# Patient Record
Sex: Female | Born: 2000 | Race: White | Hispanic: No | Marital: Single
Health system: Southern US, Community
[De-identification: ages and names within clinical notes are randomized; demographics above are authoritative.]

## PROBLEM LIST (undated history)

## (undated) DIAGNOSIS — Z789 Other specified health status: Secondary | ICD-10-CM

---

## 2020-01-19 ENCOUNTER — Ambulatory Visit (INDEPENDENT_AMBULATORY_CARE_PROVIDER_SITE_OTHER): Payer: Self-pay

## 2020-01-19 ENCOUNTER — Other Ambulatory Visit: Payer: Self-pay | Admitting: Sports Medicine

## 2020-01-19 ENCOUNTER — Ambulatory Visit (INDEPENDENT_AMBULATORY_CARE_PROVIDER_SITE_OTHER): Payer: Self-pay | Admitting: Sports Medicine

## 2020-01-19 DIAGNOSIS — S6991XA Unspecified injury of right wrist, hand and finger(s), initial encounter: Secondary | ICD-10-CM

## 2020-01-19 NOTE — Assessment & Plan Note (Addendum)
This is a pleasant 19 year old female, she injured her finger earlier today while roughhousing with a friend. Clinically there is some angulation and rotation, with tenderness at the middle phalanx, x-rays confirmed the above x-rays were personally reviewed, middle phalangeal comminuted fracture with rotation and angulation, this will need open reduction and internal fixation versus simple percutaneous fixation. Static volar splint applied today. Referral to Dr. Amanda Pea.

## 2020-01-19 NOTE — Progress Notes (Signed)
    Procedures performed today:    None.  Independent interpretation of notes and tests performed by another provider:   None.  Brief History, Exam, Impression, and Recommendations:    Heather Hurst is a pleasant 19yo female who presents today with a fractured middle phalanx of the right fourth digit. She said she broke it while she was "rough housing"/"play fighting" with a friend. She is currently in pain, but denies wanting any pain medications other than OTC ibuprofen. Due to the extent of the injury she is going to need surgical repair. We have referred her for surgery.   Aurelio Jew, MS3   ___________________________________________ Ihor Austin. Benjamin Stain, M.D., ABFM., CAQSM. Primary Care and Sports Medicine [facility]  Adjunct Instructor of Family Medicine  [employer]

## 2020-01-26 ENCOUNTER — Emergency Department (HOSPITAL_COMMUNITY): Payer: Medicaid Other

## 2020-01-26 ENCOUNTER — Emergency Department (HOSPITAL_COMMUNITY): Payer: Medicaid Other | Admitting: Anesthesiology

## 2020-01-26 ENCOUNTER — Encounter (HOSPITAL_COMMUNITY): Admission: EM | Disposition: A | Discharge status: Home / Self Care | Payer: Self-pay | Source: Home / Self Care

## 2020-01-26 ENCOUNTER — Encounter (HOSPITAL_COMMUNITY): Payer: Self-pay | Admitting: Orthopedic Surgery

## 2020-01-26 ENCOUNTER — Ambulatory Visit (HOSPITAL_COMMUNITY)
Admission: EM | Admit: 2020-01-26 | Discharge: 2020-01-26 | Disposition: A | Discharge status: Home / Self Care | Payer: Medicaid Other | Attending: Emergency Medicine | Admitting: Emergency Medicine

## 2020-01-26 DIAGNOSIS — Z20822 Contact with and (suspected) exposure to covid-19: Secondary | ICD-10-CM | POA: Insufficient documentation

## 2020-01-26 DIAGNOSIS — S62621A Displaced fracture of medial phalanx of left index finger, initial encounter for closed fracture: Secondary | ICD-10-CM

## 2020-01-26 DIAGNOSIS — S67194A Crushing injury of right ring finger, initial encounter: Secondary | ICD-10-CM | POA: Insufficient documentation

## 2020-01-26 DIAGNOSIS — X58XXXA Exposure to other specified factors, initial encounter: Secondary | ICD-10-CM | POA: Insufficient documentation

## 2020-01-26 DIAGNOSIS — S62624A Displaced fracture of medial phalanx of right ring finger, initial encounter for closed fracture: Secondary | ICD-10-CM | POA: Insufficient documentation

## 2020-01-26 DIAGNOSIS — W19XXXA Unspecified fall, initial encounter: Secondary | ICD-10-CM | POA: Insufficient documentation

## 2020-01-26 HISTORY — DX: Other specified health status: Z78.9

## 2020-01-26 HISTORY — PX: OPEN REDUCTION INTERNAL FIXATION (ORIF) HAND: SHX5991

## 2020-01-26 LAB — SARS CORONAVIRUS 2 BY RT PCR (HOSPITAL ORDER, PERFORMED IN ~~LOC~~ HOSPITAL LAB): SARS Coronavirus 2: NEGATIVE

## 2020-01-26 LAB — PREGNANCY, URINE: Preg Test, Ur: NEGATIVE

## 2020-01-26 SURGERY — OPEN REDUCTION INTERNAL FIXATION (ORIF) HAND
Anesthesia: Monitor Anesthesia Care | Site: Hand | Laterality: Right

## 2020-01-26 MED ORDER — MIDAZOLAM HCL 2 MG/2ML IJ SOLN
INTRAMUSCULAR | Status: AC
  Administered 2020-01-26: 2 mg via INTRAVENOUS
  Filled 2020-01-26: qty 2

## 2020-01-26 MED ORDER — PROPOFOL 500 MG/50ML IV EMUL
INTRAVENOUS | Status: DC | PRN
  Administered 2020-01-26: 100 ug/kg/min via INTRAVENOUS

## 2020-01-26 MED ORDER — MIDAZOLAM HCL 2 MG/2ML IJ SOLN
INTRAMUSCULAR | Status: AC
  Filled 2020-01-26: qty 2

## 2020-01-26 MED ORDER — ONDANSETRON HCL 4 MG/2ML IJ SOLN
INTRAMUSCULAR | Status: DC | PRN
  Administered 2020-01-26: 4 mg via INTRAVENOUS

## 2020-01-26 MED ORDER — LACTATED RINGERS IV SOLN: INTRAVENOUS | Status: DC | PRN

## 2020-01-26 MED ORDER — CEPHALEXIN 500 MG PO CAPS: 500.0000 mg | ORAL_CAPSULE | Freq: Four times a day (QID) | ORAL | 0 refills | Status: AC

## 2020-01-26 MED ORDER — MIDAZOLAM HCL 2 MG/2ML IJ SOLN: 2.0000 mg | Freq: Once | INTRAMUSCULAR | Status: AC

## 2020-01-26 MED ORDER — FENTANYL CITRATE (PF) 100 MCG/2ML IJ SOLN
INTRAMUSCULAR | Status: AC
  Administered 2020-01-26: 100 ug via INTRAVENOUS
  Filled 2020-01-26: qty 2

## 2020-01-26 MED ORDER — FENTANYL CITRATE (PF) 100 MCG/2ML IJ SOLN: 100.0000 ug | Freq: Once | INTRAMUSCULAR | Status: AC

## 2020-01-26 MED ORDER — PROPOFOL 10 MG/ML IV BOLUS
INTRAVENOUS | Status: DC | PRN
  Administered 2020-01-26: 25 mg via INTRAVENOUS

## 2020-01-26 MED ORDER — PHENYLEPHRINE 40 MCG/ML (10ML) SYRINGE FOR IV PUSH (FOR BLOOD PRESSURE SUPPORT)
PREFILLED_SYRINGE | INTRAVENOUS | Status: DC | PRN
  Administered 2020-01-26 (×4): 40 ug via INTRAVENOUS

## 2020-01-26 MED ORDER — CEFAZOLIN SODIUM-DEXTROSE 2-3 GM-%(50ML) IV SOLR
INTRAVENOUS | Status: DC | PRN
  Administered 2020-01-26: 2 g via INTRAVENOUS

## 2020-01-26 MED ORDER — CHLORHEXIDINE GLUCONATE 0.12 % MT SOLN
15.0000 mL | Freq: Once | OROMUCOSAL | Status: AC
  Administered 2020-01-26: 15 mL via OROMUCOSAL
  Filled 2020-01-26: qty 15

## 2020-01-26 MED ORDER — BUPIVACAINE HCL (PF) 0.25 % IJ SOLN: INTRAMUSCULAR | Status: DC | PRN

## 2020-01-26 MED ORDER — BUPIVACAINE HCL (PF) 0.25 % IJ SOLN
INTRAMUSCULAR | Status: AC
  Filled 2020-01-26: qty 30

## 2020-01-26 MED ORDER — 0.9 % SODIUM CHLORIDE (POUR BTL) OPTIME
TOPICAL | Status: DC | PRN
  Administered 2020-01-26: 1000 mL

## 2020-01-26 MED ORDER — LACTATED RINGERS IV SOLN: INTRAVENOUS | Status: DC

## 2020-01-26 MED ORDER — FENTANYL CITRATE (PF) 250 MCG/5ML IJ SOLN
INTRAMUSCULAR | Status: AC
  Filled 2020-01-26: qty 5

## 2020-01-26 MED ORDER — PROPOFOL 10 MG/ML IV BOLUS
INTRAVENOUS | Status: AC
  Filled 2020-01-26: qty 20

## 2020-01-26 MED ORDER — HYDROMORPHONE HCL 1 MG/ML IJ SOLN: 0.2500 mg | INTRAMUSCULAR | Status: DC | PRN

## 2020-01-26 MED ORDER — DIPHENHYDRAMINE HCL 50 MG/ML IJ SOLN
INTRAMUSCULAR | Status: AC
  Filled 2020-01-26: qty 1

## 2020-01-26 MED ORDER — BUPIVACAINE-EPINEPHRINE (PF) 0.5% -1:200000 IJ SOLN
INTRAMUSCULAR | Status: DC | PRN
  Administered 2020-01-26: 30 mL via PERINEURAL

## 2020-01-26 MED ORDER — MIDAZOLAM HCL 5 MG/5ML IJ SOLN
INTRAMUSCULAR | Status: DC | PRN
  Administered 2020-01-26: 2 mg via INTRAVENOUS

## 2020-01-26 MED ORDER — OXYCODONE HCL 5 MG PO TABS: 5.0000 mg | ORAL_TABLET | ORAL | 0 refills | Status: AC | PRN

## 2020-01-26 SURGICAL SUPPLY — 57 items
BIT DRILL 1.1X3.5 QK RELEA (DRILL) ×1 IMPLANT
BIT DRILL 1.1X3.5 QUICK RELEAS (DRILL) ×3
BLADE CLIPPER SURG (BLADE) IMPLANT
BNDG ELASTIC 3X5.8 VLCR STR LF (GAUZE/BANDAGES/DRESSINGS) IMPLANT
BNDG ELASTIC 4X5.8 VLCR STR LF (GAUZE/BANDAGES/DRESSINGS) IMPLANT
BNDG ESMARK 4X9 LF (GAUZE/BANDAGES/DRESSINGS) ×3 IMPLANT
BNDG GAUZE ELAST 4 BULKY (GAUZE/BANDAGES/DRESSINGS) ×3 IMPLANT
CORD BIPOLAR FORCEPS 12FT (ELECTRODE) ×3 IMPLANT
COVER SURGICAL LIGHT HANDLE (MISCELLANEOUS) ×3 IMPLANT
COVER WAND RF STERILE (DRAPES) IMPLANT
CUFF TOURN SGL QUICK 18X4 (TOURNIQUET CUFF) ×3 IMPLANT
CUFF TOURN SGL QUICK 24 (TOURNIQUET CUFF)
CUFF TRNQT CYL 24X4X16.5-23 (TOURNIQUET CUFF) IMPLANT
DECANTER SPIKE VIAL GLASS SM (MISCELLANEOUS) IMPLANT
DRAIN TLS ROUND 10FR (DRAIN) IMPLANT
DRAPE OEC MINIVIEW 54X84 (DRAPES) ×3 IMPLANT
DRAPE U-SHAPE 47X51 STRL (DRAPES) ×3 IMPLANT
DRSG ADAPTIC 3X8 NADH LF (GAUZE/BANDAGES/DRESSINGS) IMPLANT
DRSG EMULSION OIL 3X3 NADH (GAUZE/BANDAGES/DRESSINGS) ×3 IMPLANT
GAUZE SPONGE 4X4 12PLY STRL (GAUZE/BANDAGES/DRESSINGS) ×3 IMPLANT
GAUZE XEROFORM 1X8 LF (GAUZE/BANDAGES/DRESSINGS) ×3 IMPLANT
GAUZE XEROFORM 5X9 LF (GAUZE/BANDAGES/DRESSINGS) IMPLANT
GLOVE BIOGEL M 8.0 STRL (GLOVE) ×3 IMPLANT
GLOVE SS BIOGEL STRL SZ 8 (GLOVE) ×1 IMPLANT
GLOVE SUPERSENSE BIOGEL SZ 8 (GLOVE) ×2
GOWN STRL REUS W/ TWL LRG LVL3 (GOWN DISPOSABLE) ×3 IMPLANT
GOWN STRL REUS W/ TWL XL LVL3 (GOWN DISPOSABLE) ×3 IMPLANT
GOWN STRL REUS W/TWL LRG LVL3 (GOWN DISPOSABLE) ×6
GOWN STRL REUS W/TWL XL LVL3 (GOWN DISPOSABLE) ×6
KIT BASIN OR (CUSTOM PROCEDURE TRAY) ×3 IMPLANT
KIT TURNOVER KIT B (KITS) ×3 IMPLANT
MANIFOLD NEPTUNE II (INSTRUMENTS) ×3 IMPLANT
NEEDLE 22X1 1/2 (OR ONLY) (NEEDLE) IMPLANT
NS IRRIG 1000ML POUR BTL (IV SOLUTION) ×3 IMPLANT
PACK ORTHO EXTREMITY (CUSTOM PROCEDURE TRAY) ×3 IMPLANT
PAD ARMBOARD 7.5X6 YLW CONV (MISCELLANEOUS) ×6 IMPLANT
PAD CAST 4YDX4 CTTN HI CHSV (CAST SUPPLIES) ×3 IMPLANT
PADDING CAST COTTON 4X4 STRL (CAST SUPPLIES) ×6
PLATE COMPRESS 0.8 6H N/S HAND (Plate) ×3 IMPLANT
SCREW BONE LAG 1.5X10MM HEXA (Screw) ×1 IMPLANT
SCREW LAG HEXALOBE 1.5X10 (Screw) ×3 IMPLANT
SCREW NLOCK 1.5X9 (Screw) ×6 IMPLANT
SOL PREP POV-IOD 4OZ 10% (MISCELLANEOUS) ×6 IMPLANT
SPONGE LAP 4X18 RFD (DISPOSABLE) IMPLANT
SUT MNCRL AB 4-0 PS2 18 (SUTURE) ×3 IMPLANT
SUT PROLENE 3 0 PS 2 (SUTURE) IMPLANT
SUT PROLENE 4 0 PS 2 18 (SUTURE) ×3 IMPLANT
SUT VIC AB 3-0 FS2 27 (SUTURE) IMPLANT
SYR CONTROL 10ML LL (SYRINGE) ×3 IMPLANT
SYSTEM CHEST DRAIN TLS 7FR (DRAIN) IMPLANT
TOWEL GREEN STERILE (TOWEL DISPOSABLE) ×3 IMPLANT
TOWEL GREEN STERILE FF (TOWEL DISPOSABLE) ×3 IMPLANT
TUBE CONNECTING 12'X1/4 (SUCTIONS)
TUBE CONNECTING 12X1/4 (SUCTIONS) IMPLANT
TUBE EVACUATION TLS (MISCELLANEOUS) IMPLANT
UNDERPAD 30X36 HEAVY ABSORB (UNDERPADS AND DIAPERS) ×3 IMPLANT
WATER STERILE IRR 1000ML POUR (IV SOLUTION) ×3 IMPLANT

## 2020-01-26 NOTE — Anesthesia Preprocedure Evaluation (Addendum)
Anesthesia Evaluation  Patient identified by MRN, date of birth, ID band Patient awake    Reviewed: Allergy & Precautions, H&P , NPO status , Patient's Chart, lab work & pertinent test results  Airway Mallampati: II  TM Distance: >3 FB Neck ROM: Full    Dental no notable dental hx. (+) Teeth Intact, Dental Advisory Given   Pulmonary neg pulmonary ROS,    Pulmonary exam normal breath sounds clear to auscultation       Cardiovascular negative cardio ROS   Rhythm:Regular Rate:Normal     Neuro/Psych negative neurological ROS  negative psych ROS   GI/Hepatic negative GI ROS, Neg liver ROS,   Endo/Other  negative endocrine ROS  Renal/GU negative Renal ROS  negative genitourinary   Musculoskeletal   Abdominal   Peds  Hematology negative hematology ROS (+)   Anesthesia Other Findings   Reproductive/Obstetrics negative OB ROS                            Anesthesia Physical Anesthesia Plan  ASA: II  Anesthesia Plan: MAC and Regional   Post-op Pain Management:    Induction: Intravenous  PONV Risk Score and Plan: 3 and Ondansetron, Midazolam and Propofol infusion  Airway Management Planned: Simple Face Mask  Additional Equipment:   Intra-op Plan:   Post-operative Plan:   Informed Consent: I have reviewed the patients History and Physical, chart, labs and discussed the procedure including the risks, benefits and alternatives for the proposed anesthesia with the patient or authorized representative who has indicated his/her understanding and acceptance.     Dental advisory given  Plan Discussed with: CRNA  Anesthesia Plan Comments:        Anesthesia Quick Evaluation

## 2020-01-26 NOTE — Discharge Instructions (Signed)
Please elevate and keep your bandage clean and dry.  Please do not get it wet.  We have phoned in an antibiotic for you as well as a pain medicine.  Your arm will be numb for a period of time and then will improve in terms of his sensation.  Please do not take your bandage off.  We will see you in 14 days to remove your stitches.  You may move your finger within the confines of your splint but do not take your splint off.Keep bandage clean and dry.  Call for any problems.  No smoking.  Criteria for driving a car: you should be off your pain medicine for 7-8 hours, able to drive one handed(confident), thinking clearly and feeling able in your judgement to drive. Continue elevation as it will decrease swelling.  If instructed by MD move your fingers within the confines of the bandage/splint.  Use ice if instructed by your MD. Call immediately for any sudden loss of feeling in your hand/arm or change in functional abilities of the extremity.We recommend that you to take vitamin C 1000 mg a day to promote healing. We also recommend that if you require  pain medicine that you take a stool softener to prevent constipation as most pain medicines will have constipation side effects. We recommend either Peri-Colace or Senokot and recommend that you also consider adding MiraLAX as well to prevent the constipation affects from pain medicine if you are required to use them. These medicines are over the counter and may be purchased at a local pharmacy. A cup of yogurt and a probiotic can also be helpful during the recovery process as the medicines can disrupt your intestinal environment.

## 2020-01-26 NOTE — Anesthesia Procedure Notes (Signed)
Procedure Name: MAC Date/Time: 01/26/2020 7:15 PM Performed by: Jearld Pies, CRNA Pre-anesthesia Checklist: Patient identified, Emergency Drugs available, Suction available and Patient being monitored Patient Re-evaluated:Patient Re-evaluated prior to induction Oxygen Delivery Method: Simple face mask Preoxygenation: Pre-oxygenation with 100% oxygen

## 2020-01-26 NOTE — Transfer of Care (Signed)
Immediate Anesthesia Transfer of Care Note  Patient: Heather Hurst  Procedure(s) Performed: OPEN REDUCTION INTERNAL FIXATION (ORIF) right ring finger (Right Hand)  Patient Location: PACU  Anesthesia Type:MAC and Regional  Level of Consciousness: awake, alert  and oriented  Airway & Oxygen Therapy: Patient Spontanous Breathing and Patient connected to nasal cannula oxygen  Post-op Assessment: Report given to RN and Post -op Vital signs reviewed and stable  Post vital signs: Reviewed and stable  Last Vitals:  Vitals Value Taken Time  BP 116/85 01/26/20 2031  Temp 37.1 C 01/26/20 2030  Pulse 91 01/26/20 2036  Resp 22 01/26/20 2036  SpO2 100 % 01/26/20 2036  Vitals shown include unvalidated device data.  Last Pain:  Vitals:   01/26/20 2030  TempSrc:   PainSc: Asleep         Complications: No complications documented.

## 2020-01-26 NOTE — Anesthesia Procedure Notes (Signed)
Anesthesia Regional Block: Supraclavicular block   Pre-Anesthetic Checklist: ,, timeout performed, Correct Patient, Correct Site, Correct Laterality, Correct Procedure, Correct Position, site marked, Risks and benefits discussed, pre-op evaluation,  At surgeon's request and post-op pain management  Laterality: Right  Prep: Maximum Sterile Barrier Precautions used, chloraprep       Needles:  Injection technique: Single-shot  Needle Type: Echogenic Stimulator Needle     Needle Length: 9cm  Needle Gauge: 21     Additional Needles:   Procedures:,,,, ultrasound used (permanent image in chart),,,,  Narrative:  Start time: 01/26/2020 6:45 PM End time: 01/26/2020 6:55 PM Injection made incrementally with aspirations every 5 mL. Anesthesiologist: Gaynelle Adu, MD  Additional Notes: 2% Lidocaine skin wheel.

## 2020-01-26 NOTE — ED Triage Notes (Signed)
Pt fell and tried to catch herself with R hand, injuring R ring finger on 8/17. Supposed to f/u with ortho, but went today and was told she didn't have an appointment so was sent to the ED.

## 2020-01-26 NOTE — Op Note (Signed)
Operative note January 26, 2020  Heather Hurst  Preoperative diagnosis right ring finger middle phalanx fracture displaced intra-articular  Postop diagnosis the same  Operative procedure #1 open reduction internal fixation with an Acumed plate and screw construct right ring finger middle phalanx fracture #2 5 view radiographic series right ring finger #3 extensor tenolysis tenosynovectomy right ring finger  Surgeon Heather Hurst  Anesthesia block with IV sedation  Tourniquet time less than an hour  Estimated blood loss minimal  Description of procedure: Patient was taken to the operative theater counseled extensively.  Given the comminuted complex angulated and intra-articular nature of the fracture we elected to proceed with surgery.  She was taken to the surgical rain and underwent IV sedation as a block was in excellent working fashion.  Once this was complete the patient then underwent preop scrub and sterile draping.  Timeout was observed antibiotics were given in the form of Ancef and she underwent evaluation under fluoroscopy.  The displaced angulated fracture was noted.  I very carefully made an incision about the dorsal ulnar aspect of the finger dissection was carried down extensor tenolysis tenosynovectomy was accomplished due to the timeframe duration from injury to surgery.  I very carefully dealt with the tendon and preserve this.  The patient tolerated this well.  Following this I then reduce the fracture and applied a 4-hole Acumed plate.  I was able to achieve 4 cortices proximal and 2 cortices distal this construct was stable look well and the finger had excellent splay range of motion and rotatory alignment.  I was quite pleased with this.  I will begin early range of motion.  We irrigated copiously closed wound and took final copy x-rays.  There were no complicating features.  The tendon looks well following the surgery.  She will be discharged home on oxycodone and  Keflex.  She will return to see me in 14 days for suture removal and will begin therapy at that time.  All questions have been addressed.  Heather Millette MD

## 2020-01-26 NOTE — ED Provider Notes (Signed)
[facility] EMERGENCY DEPARTMENT Provider Note   CSN: [id number] Arrival date & time: 01/26/20  1333     History Chief Complaint  Patient presents with  . Finger Injury    Alan Riles is a 19 y.o. female.  HPI  Patient is a 19 year old female with no pertinent past medical history presented today with right ring finger pain that began on 8/17 when she fell to the ground and crushed her right finger.  She was seen by urgent care and found to have a fracture of the middle phalanx of her right middle finger with comminution.  Patient states that she has had relatively unchanged symptoms since that time.  She states the pain is achy, constant, worse with movement and touch.  She has been wearing a splint.   Patient sent to emergency department for surgical intervention by Dr. Amanda Pea.     No past medical history on file.  Patient Active Problem List   Diagnosis Date Noted  . Injury of right ring finger 01/19/2020    OB History   No obstetric history on file.     No family history on file.  Social History   Tobacco Use  . Smoking status: Not on file  Substance Use Topics  . Alcohol use: Not on file  . Drug use: Not on file    Home Medications Prior to Admission medications   Not on File    Allergies    Patient has no known allergies.  Review of Systems   Review of Systems  Constitutional: Negative for fever.  HENT: Negative for congestion.   Respiratory: Negative for shortness of breath.   Cardiovascular: Negative for chest pain.  Musculoskeletal:       Right ring finger pain  Neurological: Negative for dizziness and headaches.    Physical Exam Updated Vital Signs BP 121/77   Pulse 92   Temp 98.5 F (36.9 C) (Oral)   Resp 16   Ht 5' 2.5" (1.588 m)   Wt 79.4 kg   SpO2 99%   BMI 31.50 kg/m   Physical Exam Vitals and nursing note reviewed.  Constitutional:      General: She is not in acute distress.    Appearance: Normal  appearance. She is not ill-appearing.  HENT:     Head: Normocephalic and atraumatic.  Eyes:     General: No scleral icterus.       Right eye: No discharge.        Left eye: No discharge.     Conjunctiva/sclera: Conjunctivae normal.  Pulmonary:     Effort: Pulmonary effort is normal.     Breath sounds: No stridor.  Musculoskeletal:     Comments: Splint removed Right ring finger with small amount of bruising.  TTP over middle phalanx  Skin:    General: Skin is warm and dry.     Capillary Refill: Capillary refill takes less than 2 seconds.     Comments: Cap refill less than 2 seconds  Neurological:     Mental Status: She is alert and oriented to person, place, and time. Mental status is at baseline.     Comments: Sensation intact in fingertips     ED Results / Procedures / Treatments   Labs (all labs ordered are listed, but only abnormal results are displayed) Labs Reviewed  SARS CORONAVIRUS 2 BY RT PCR (HOSPITAL ORDER, PERFORMED IN [facility])    EKG None  Radiology No results found.  Procedures  Procedures (including critical care time)  Medications Ordered in ED Medications - No data to display  ED Course  I have reviewed the triage vital signs and the nursing notes.  Pertinent labs & imaging results that were available during my care of the patient were reviewed by me and considered in my medical decision making (see chart for details).  Patient is a 19 year old female presented today with right ring finger pain.  She was found to have a right middle phalanx comminuted fracture of the right ring finger 8/17 when she was seen in urgent care.  She still has splint in place today.  She informs the that she was sent to emergency department for surgery by hand surgery office.  Physical exam is notable for bruising to the right ring finger. I reviewed patient's x-ray from 8/17.  Agree of radiology read.  There is a comminuted right ring finger fracture of  the middle phalanx.  Clinical Course as of Jan 25 1649  Tue Jan 26, 2020  1642 Discussed with Dr. Amanda Pea who will operate on patient today. Covid swab obtained and sent to lab.   [WF]    Clinical Course User Index [WF] Gailen Shelter, [place]   MDM Rules/Calculators/A&P                          Covid swab in process at 4:46 PM.  Patient tentatively scheduled for surgery at 6 PM pending Covid swab completion.  Final Clinical Impression(s) / ED Diagnoses Final diagnoses:  Displaced fracture of middle phalanx of left index finger, initial encounter for closed fracture    Rx / DC Orders ED Discharge Orders    None       Gailen Shelter, [place] 01/26/20 1714    Eber Hong, MD 01/27/20 [phone]

## 2020-01-26 NOTE — H&P (Signed)
Dariane Natzke is an 19 y.o. female.   Chief Complaint: Patient presents with a finger fracture that is now 11 week old or greater.  She has comminuted displaced and articular finger fracture about the ring finger.  She presents for evaluation and surgical management. HPI: Patient has a significantly displaced fracture about the middle phalanx ring finger right hand.  Given the intra-articular nature and displacement we will plan for surgical repair reconstruction.  She understands this and the findings.  Patient presents for evaluation and treatment of the of their upper extremity predicament. The patient denies neck, back, chest or  abdominal pain. The patient notes that they have no lower extremity problems. The patients primary complaint is noted. We are planning surgical care pathway for the upper extremity.  No past medical history on file.   No family history on file. Social History:  has no history on file for tobacco use, alcohol use, and drug use.  Allergies: No Known Allergies  (Not in a hospital admission)   No results found for this or any previous visit (from the past 48 hour(s)). No results found.  Review of Systems  Respiratory: Negative.   Cardiovascular: Negative.   Gastrointestinal: Negative.   Endocrine: Negative.   Genitourinary: Negative.     Blood pressure 121/77, pulse 92, temperature 98.5 F (36.9 C), temperature source Oral, resp. rate 16, height 5' 2.5" (1.588 m), weight 79.4 kg, SpO2 99 %. Physical Exam  Comminuted complex displaced middle phalanx fracture right ring finger.  The adjacent fingers are stable.  There is no signs of dystrophy or infection.  I reviewed all issues with patient at length and the relevant findings. Patient denies pregnancy she denies neck back chest or abdominal pain. The patient is alert and oriented in no acute distress. The patient complains of pain in the affected upper extremity.  The patient is noted to have a  normal HEENT exam. Lung fields show equal chest expansion and no shortness of breath. Abdomen exam is nontender without distention. Lower extremity examination does not show any fracture dislocation or blood clot symptoms. Pelvis is stable and the neck and back are stable and nontender. Assessment/Plan Patient has comminuted complex fracture about the middle phalanx right ring finger.  We will plan for ORIF.  This will be an open reduction internal fixation with repair is necessary.  We are planning surgery for your upper extremity. The risk and benefits of surgery to include risk of bleeding, infection, anesthesia,  damage to normal structures and failure of the surgery to accomplish its intended goals of relieving symptoms and restoring function have been discussed in detail. With this in mind we plan to proceed. I have specifically discussed with the patient the pre-and postoperative regime and the dos and don'ts and risk and benefits in great detail. Risk and benefits of surgery also include risk of dystrophy(CRPS), chronic nerve pain, failure of the healing process to go onto completion and other inherent risks of surgery The relavent the pathophysiology of the disease/injury process, as well as the alternatives for treatment and postoperative course of action has been discussed in great detail with the patient who desires to proceed.  We will do everything in our power to help you (the patient) restore function to the upper extremity. It is a pleasure to see this patient today.   Oletta Cohn III, MD 01/26/2020, 5:23 PM

## 2020-01-26 NOTE — Anesthesia Postprocedure Evaluation (Signed)
Anesthesia Post Note  Patient: Statistician  Procedure(s) Performed: OPEN REDUCTION INTERNAL FIXATION (ORIF) right ring finger (Right Hand)     Patient location during evaluation: PACU Anesthesia Type: Regional and MAC Level of consciousness: awake and alert Pain management: pain level controlled Vital Signs Assessment: post-procedure vital signs reviewed and stable Respiratory status: spontaneous breathing, nonlabored ventilation and respiratory function stable Cardiovascular status: stable and blood pressure returned to baseline Postop Assessment: no apparent nausea or vomiting Anesthetic complications: no   No complications documented.  Last Vitals:  Vitals:   01/26/20 2045 01/26/20 2100  BP: 123/76 (!) 143/74  Pulse: (!) 104 94  Resp: (!) 21 18  Temp:  (P) 37.1 C  SpO2: 98% 96%    Last Pain:  Vitals:   01/26/20 2100  TempSrc:   PainSc: (P) 0-No pain                 Perline Awe,W. EDMOND

## 2020-01-27 ENCOUNTER — Encounter (HOSPITAL_COMMUNITY): Payer: Self-pay | Admitting: Orthopedic Surgery

## 2021-10-03 IMAGING — DX DG FINGER RING 2+V*R*
3 series · 3 of 3 positions shown · non-contrast
Comparison: No prior.

CLINICAL DATA: Middle phalanx fracture.

EXAM:
RIGHT RING FINGER 2+V

[finger ap]
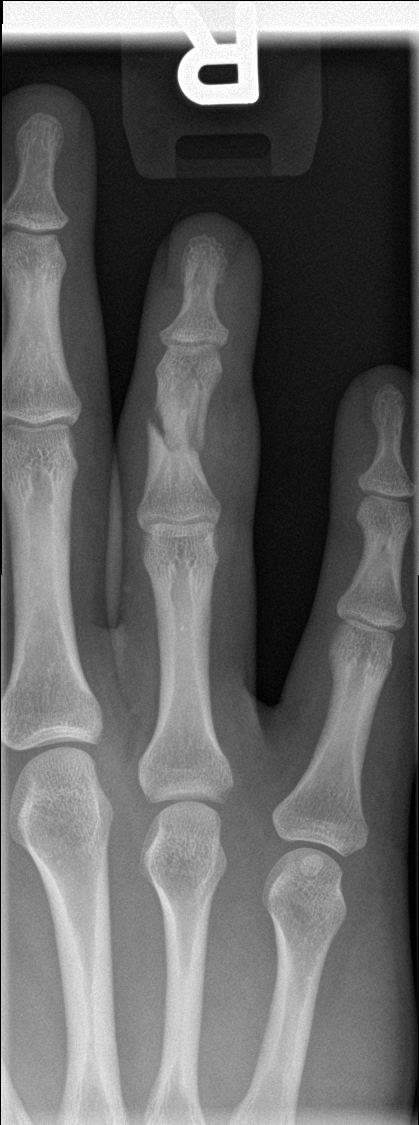

[finger obl]
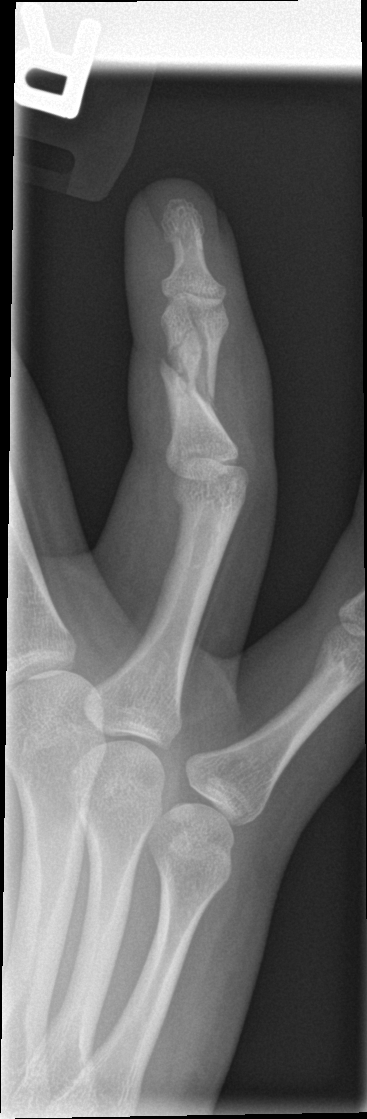

[finger lat]
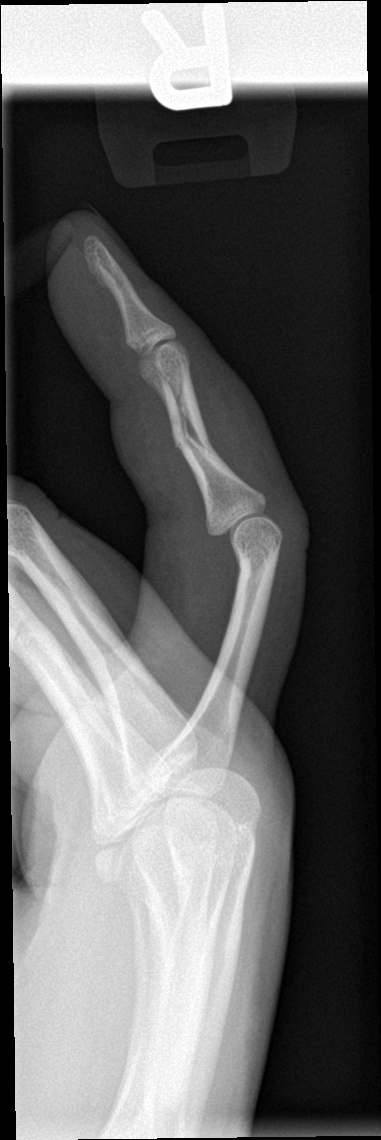

[3 of 3 positions shown; findings below may reference images not displayed]

FINDINGS: Comminuted, angulated, displaced fracture of the middle phalanx of
the right fourth digit noted. Extension of the fracture into the
distal interphalangeal joint noted. Soft tissue swelling. No
radiopaque foreign body.
IMPRESSION: Comminuted, angulated, displaced fracture of the middle phalanx of
the right fourth digit noted. Extension of the fracture into the
distal interphalangeal joint noted.
